# Patient Record
Sex: Male | Born: 1995 | Race: Black or African American | Hispanic: No | Marital: Single | Smoking: Never smoker
Health system: Southern US, Community
[De-identification: ages and names within clinical notes are randomized; demographics above are authoritative.]

---
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
## 2017-12-18 ENCOUNTER — Encounter (HOSPITAL_COMMUNITY): Payer: Self-pay

## 2017-12-18 ENCOUNTER — Emergency Department (HOSPITAL_COMMUNITY): Payer: Self-pay

## 2017-12-18 ENCOUNTER — Emergency Department (HOSPITAL_COMMUNITY)
Admission: EM | Admit: 2017-12-18 | Discharge: 2017-12-18 | Disposition: A | Discharge status: Home / Self Care | Payer: Self-pay | Attending: Emergency Medicine | Admitting: Emergency Medicine

## 2017-12-18 DIAGNOSIS — Y939 Activity, unspecified: Secondary | ICD-10-CM | POA: Insufficient documentation

## 2017-12-18 DIAGNOSIS — Y999 Unspecified external cause status: Secondary | ICD-10-CM | POA: Insufficient documentation

## 2017-12-18 DIAGNOSIS — S0993XA Unspecified injury of face, initial encounter: Secondary | ICD-10-CM | POA: Insufficient documentation

## 2017-12-18 DIAGNOSIS — Y929 Unspecified place or not applicable: Secondary | ICD-10-CM | POA: Insufficient documentation

## 2017-12-18 DIAGNOSIS — S2232XA Fracture of one rib, left side, initial encounter for closed fracture: Secondary | ICD-10-CM | POA: Insufficient documentation

## 2017-12-18 MED ORDER — OXYCODONE-ACETAMINOPHEN 5-325 MG PO TABS: 1.0000 | ORAL_TABLET | Freq: Four times a day (QID) | ORAL | 0 refills | Status: AC | PRN

## 2017-12-18 MED ORDER — IBUPROFEN 200 MG PO TABS
600.0000 mg | ORAL_TABLET | Freq: Once | ORAL | Status: AC
Start: 2017-12-18 — End: 2017-12-18
  Administered 2017-12-18: 600 mg via ORAL
  Filled 2017-12-18: qty 1

## 2017-12-18 MED ORDER — TETRACAINE HCL 0.5 % OP SOLN
1.0000 [drp] | Freq: Once | OPHTHALMIC | Status: AC
  Administered 2017-12-18: 1 [drp] via OPHTHALMIC
  Filled 2017-12-18: qty 4

## 2017-12-18 MED ORDER — FLUORESCEIN SODIUM 1 MG OP STRP
1.0000 | ORAL_STRIP | Freq: Once | OPHTHALMIC | Status: AC
  Administered 2017-12-18: 1 via OPHTHALMIC
  Filled 2017-12-18: qty 1

## 2017-12-18 NOTE — ED Notes (Signed)
Patient transported to X-ray 
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
## 2017-12-18 NOTE — Discharge Instructions (Addendum)
Please read attached information. If you experience any new or worsening signs or symptoms please return to the emergency room for evaluation. Please follow-up with your primary care provider or specialist as discussed. Please use medication prescribed only as directed and discontinue taking if you have any concerning signs or symptoms.   °
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
